# Patient Record
Sex: Male | Born: 1980 | Race: White | Hispanic: No | Marital: Married | Smoking: Current every day smoker
Health system: Southern US, Community
[De-identification: ages and names within clinical notes are randomized; demographics above are authoritative.]

---

## 2004-02-21 ENCOUNTER — Emergency Department (HOSPITAL_COMMUNITY): Admission: EM | Admit: 2004-02-21 | Discharge: 2004-02-21 | Payer: Self-pay | Admitting: Emergency Medicine

## 2009-08-09 ENCOUNTER — Emergency Department (HOSPITAL_COMMUNITY): Admission: EM | Admit: 2009-08-09 | Discharge: 2009-08-10 | Payer: Self-pay | Admitting: Emergency Medicine

## 2009-08-10 ENCOUNTER — Encounter (INDEPENDENT_AMBULATORY_CARE_PROVIDER_SITE_OTHER): Payer: Self-pay | Admitting: Emergency Medicine

## 2010-09-07 LAB — URINALYSIS, ROUTINE W REFLEX MICROSCOPIC
Glucose, UA: NEGATIVE mg/dL
Leukocytes, UA: NEGATIVE

## 2010-09-07 LAB — CK TOTAL AND CKMB (NOT AT ARMC)
Total CK: 179 U/L (ref 7–232)
Total CK: 186 U/L (ref 7–232)

## 2010-09-07 LAB — DIFFERENTIAL
Eosinophils Absolute: 0.1 10*3/uL (ref 0.0–0.7)
Lymphocytes Relative: 17 % (ref 12–46)
Monocytes Relative: 7 % (ref 3–12)
Neutro Abs: 9.3 10*3/uL — ABNORMAL HIGH (ref 1.7–7.7)
Neutrophils Relative %: 76 % (ref 43–77)

## 2010-09-07 LAB — POCT CARDIAC MARKERS
CKMB, poc: <1 ng/mL — ABNORMAL LOW (ref 1.0–8.0)
Troponin i, poc: <0.05 ng/mL (ref 0.00–0.09)

## 2010-09-07 LAB — RAPID URINE DRUG SCREEN, HOSP PERFORMED
Amphetamines: NOT DETECTED
Cocaine: NOT DETECTED
Opiates: NOT DETECTED

## 2010-09-07 LAB — URINE MICROSCOPIC-ADD ON

## 2010-09-07 LAB — POCT I-STAT, CHEM 8
Chloride: 110 mEq/L (ref 96–112)
HCT: 42 % (ref 39.0–52.0)
Hemoglobin: 14.3 g/dL (ref 13.0–17.0)

## 2010-09-07 LAB — TROPONIN I: Troponin I: 0.03 ng/mL (ref 0.00–0.06)

## 2010-09-07 LAB — CBC
Platelets: 197 10*3/uL (ref 150–400)
RBC: 4.38 MIL/uL (ref 4.22–5.81)
RDW: 12.5 % (ref 11.5–15.5)
WBC: 12.3 10*3/uL — ABNORMAL HIGH (ref 4.0–10.5)

## 2011-05-30 IMAGING — CR DG CHEST 2V
2 series · 2 of 2 positions shown · non-contrast
Comparison: None

CLINICAL DATA: Chest pain

CHEST - 2 VIEW

[w chest pa]
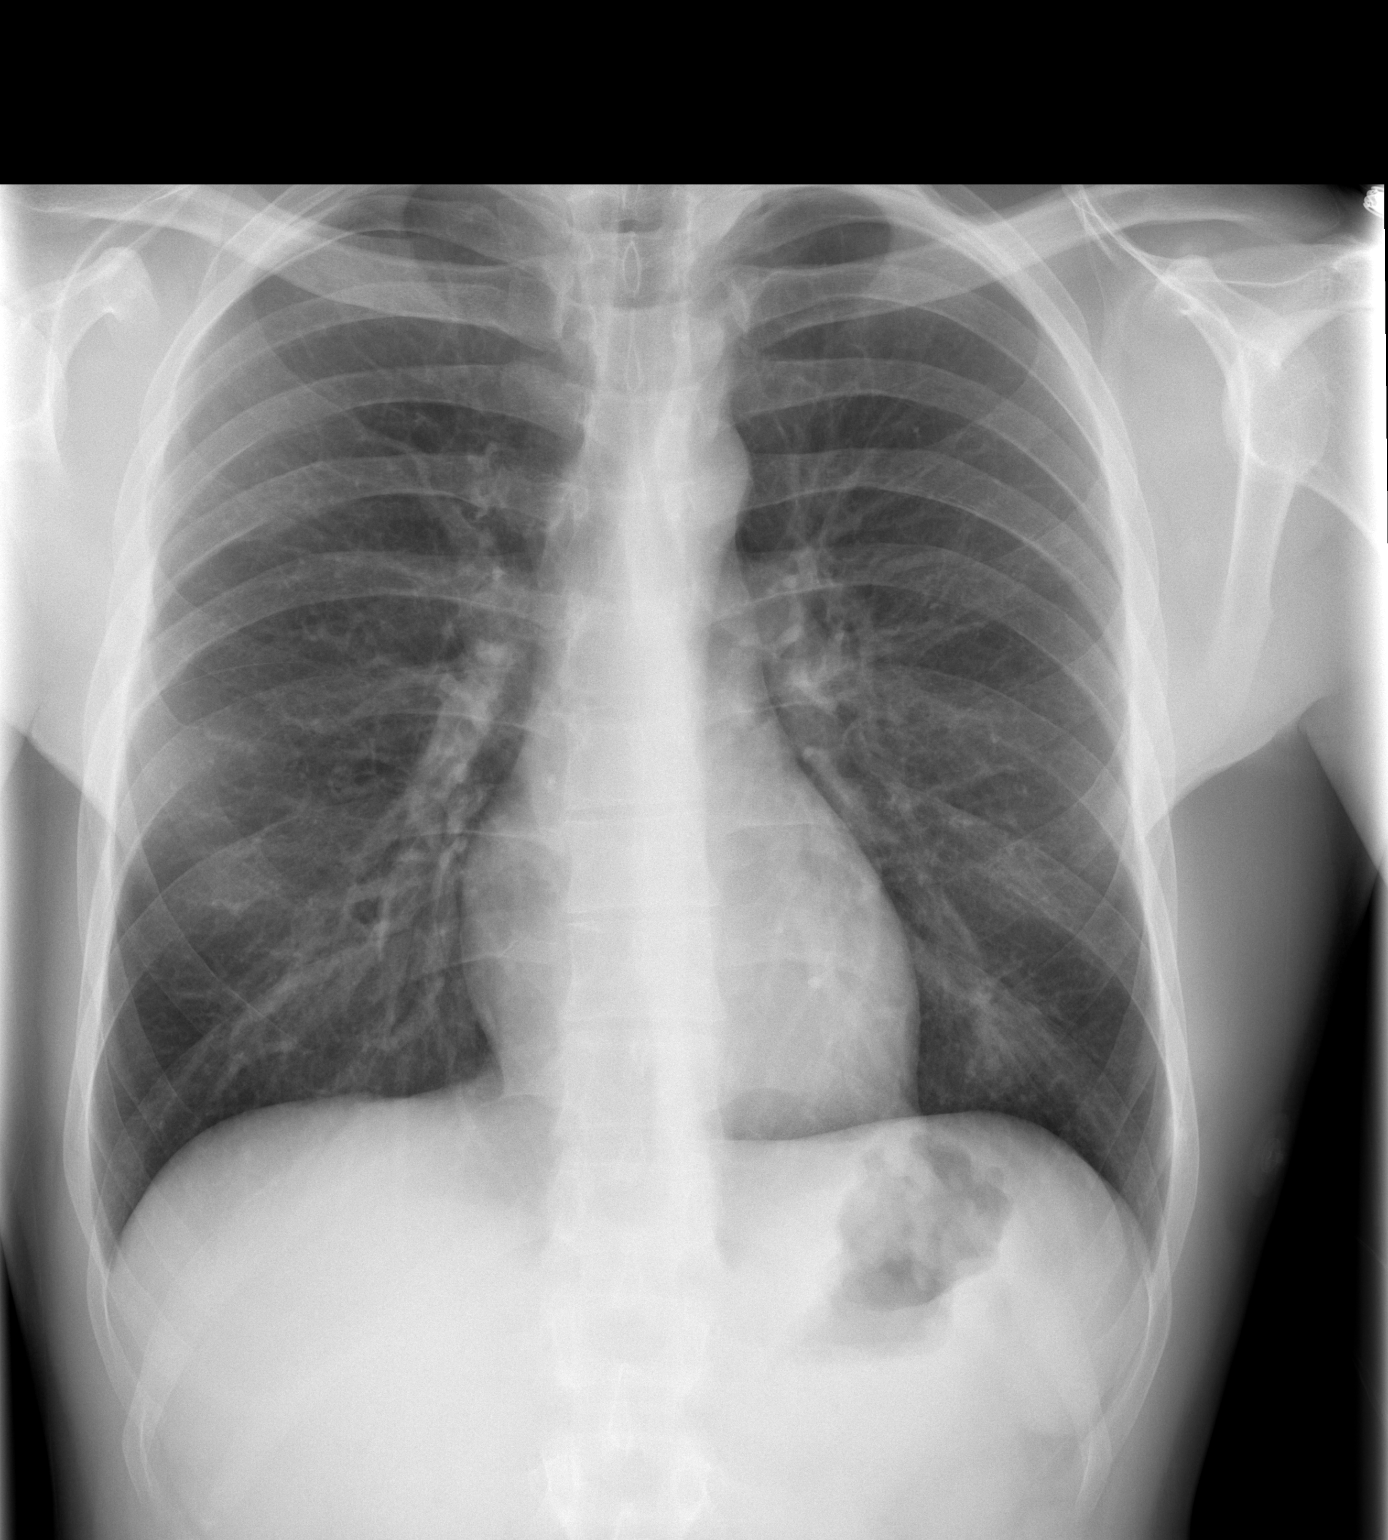

[w chest lat]
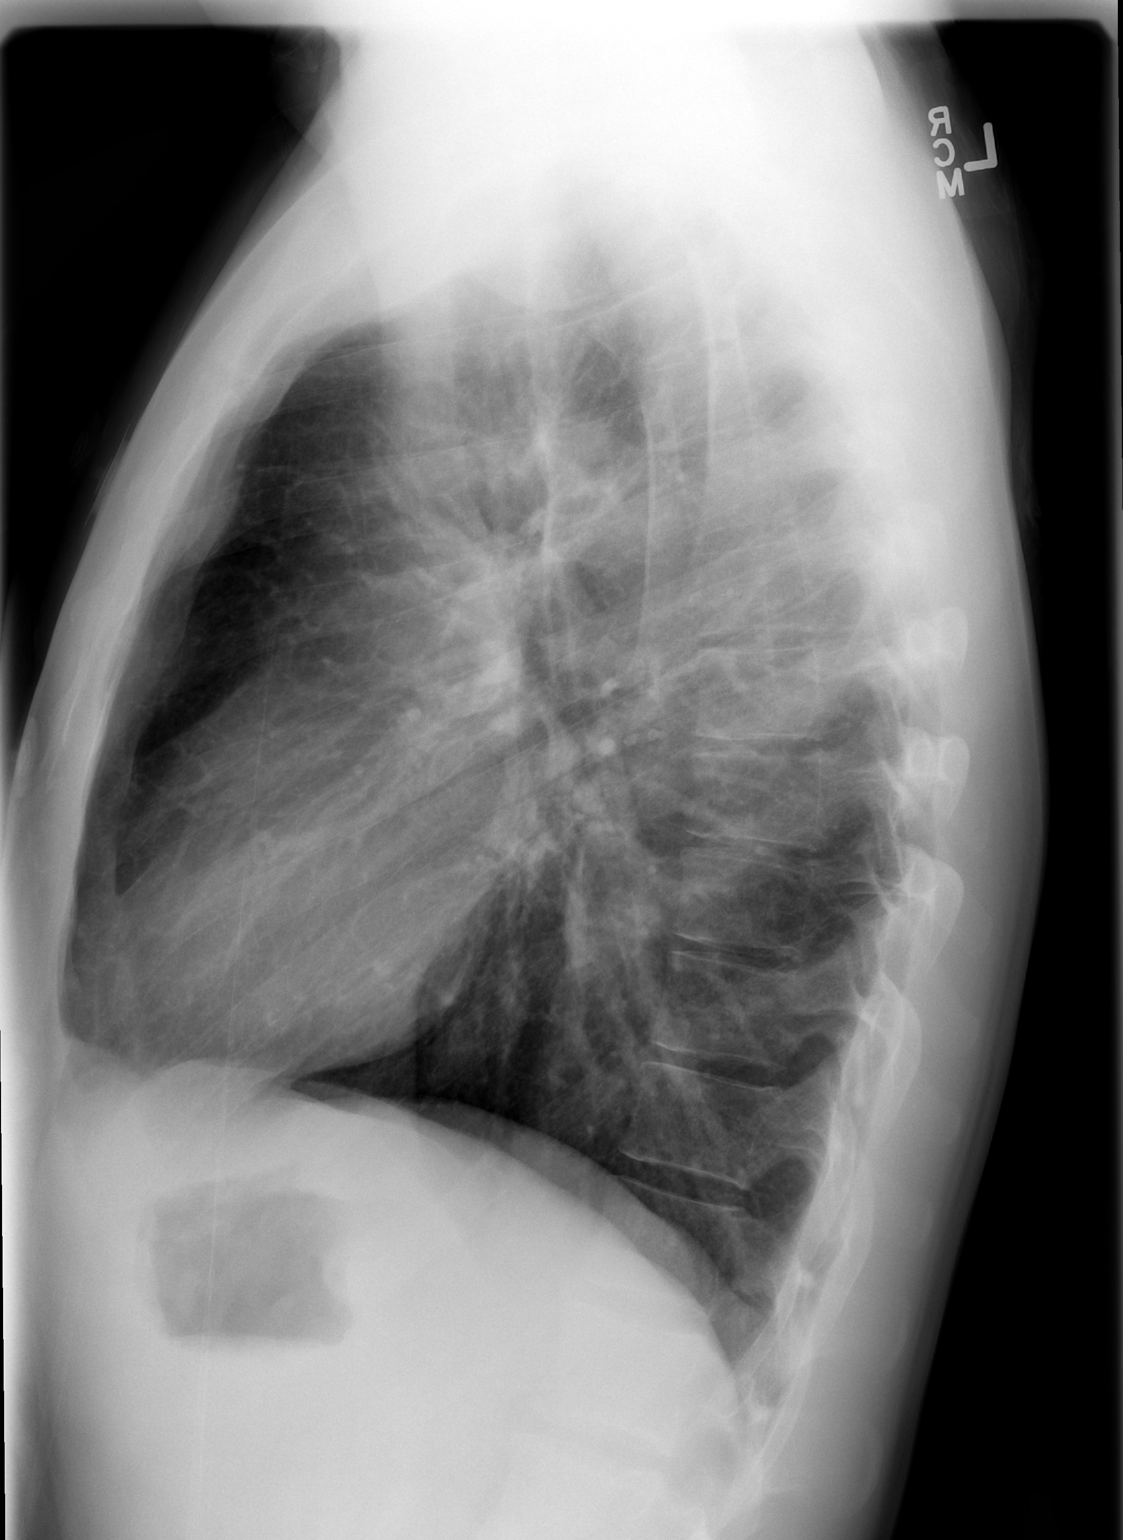

[2 of 2 positions shown; findings below may reference images not displayed]

FINDINGS: Heart and mediastinal contours normal.  Lungs clear.  No
pleural fluid.  Osseous structures and soft tissues unremarkable.
IMPRESSION: No active disease.

## 2014-04-10 ENCOUNTER — Encounter: Payer: Self-pay | Admitting: Emergency Medicine

## 2014-04-10 ENCOUNTER — Emergency Department (INDEPENDENT_AMBULATORY_CARE_PROVIDER_SITE_OTHER)
Admission: EM | Admit: 2014-04-10 | Discharge: 2014-04-10 | Disposition: A | Discharge status: Home / Self Care | Payer: Self-pay | Source: Home / Self Care | Attending: Family Medicine | Admitting: Family Medicine

## 2014-04-10 DIAGNOSIS — M545 Low back pain, unspecified: Secondary | ICD-10-CM

## 2014-04-10 MED ORDER — DICLOFENAC SODIUM 75 MG PO TBEC: 75.0000 mg | DELAYED_RELEASE_TABLET | Freq: Two times a day (BID) | ORAL | Status: AC | PRN

## 2014-04-10 MED ORDER — CYCLOBENZAPRINE HCL 10 MG PO TABS: 10.0000 mg | ORAL_TABLET | Freq: Every evening | ORAL | Status: AC | PRN

## 2014-04-10 MED ORDER — TRAMADOL HCL 50 MG PO TABS: 50.0000 mg | ORAL_TABLET | Freq: Four times a day (QID) | ORAL | Status: AC | PRN

## 2014-04-10 NOTE — ED Provider Notes (Signed)
Anthony Holloway is a 33 y.o. male who presents to Urgent Care today for low back pain. Patient notes that 2 week history of low back pain. He notes an initial injury occurring at home where he felt a pulling sensation in his back. His pain was improving significantly until today at work when he felt a popping sensation while lifting a heavy pipe. He notes significant pain in his bilateral low back that radiates to the upper back. He denies any weakness or numbness by bladder dysfunction or difficulty walking.   History reviewed. No pertinent past medical history. History  Substance Use Topics  . Smoking status: Current Every Day Smoker -- 1.00 packs/day    Types: Cigarettes  . Smokeless tobacco: Never Used  . Alcohol Use: No   ROS as above Medications: No current facility-administered medications for this encounter.   Current Outpatient Prescriptions  Medication Sig Dispense Refill  . cyclobenzaprine (FLEXERIL) 10 MG tablet Take 1 tablet (10 mg total) by mouth at bedtime as needed for muscle spasms.  20 tablet  0  . diclofenac (VOLTAREN) 75 MG EC tablet Take 1 tablet (75 mg total) by mouth 2 (two) times daily as needed.  60 tablet  0  . traMADol (ULTRAM) 50 MG tablet Take 1 tablet (50 mg total) by mouth every 6 (six) hours as needed.  15 tablet  0    Exam:  BP 129/68  Pulse 70  Temp(Src) 98.1 F (36.7 C) (Oral)  Resp 16  Ht 5\' 11"  (1.803 m)  Wt 148 lb (67.132 kg)  BMI 20.65 kg/m2  SpO2 98% Gen: Well NAD HEENT: EOMI,  MMM Lungs: Normal work of breathing. CTABL Heart: RRR no MRG Abd: NABS, Soft. Nondistended, Nontender Exts: Brisk capillary refill, warm and well perfused.  Low back: Nontender to spinal midline. Mild degenerative palpation bilateral lumbar paraspinal. Decreased flexion due to pain. Lower extremity strength is intact throughout. Reflexes are equal bilateral knees. Normal gait. Negative straight leg raise test and Faber test bilaterally.   No results found for  this or any previous visit (from the past 24 hour(s)). No results found.  Assessment and Plan: 33 y.o. male with lumbago without sciatica. Treatment with Flexeril and diclofenac tramadol heating pad and physical therapy. Up to 3 day work note provided.  Discussed warning signs or symptoms. Please see discharge instructions. Patient expresses understanding.     Rodolph BongEvan S Marcele Kosta, MD 04/10/14 1400

## 2014-04-10 NOTE — ED Notes (Signed)
First injury to lower back x 2 weeks ago while pulling a pipe at home.  Today pulled a pipe while at work.  Pain to lower back worsened with "feeling a pop" and radiating pain to shoulder area.

## 2014-04-10 NOTE — Discharge Instructions (Signed)
Thank you for coming in today. Attend physical therapy Use a heating pad. Take diclofenac twice daily for pain as needed. This medicine is okay to take at work. Take Flexeril and tramadol as needed for severe pain. Do not take and drive or work around heavy machinery. Come back or go to the emergency room if you notice new weakness new numbness problems walking or bowel or bladder problems.   Back Exercises These exercises may help you when beginning to rehabilitate your injury. Your symptoms may resolve with or without further involvement from your physician, physical therapist or athletic trainer. While completing these exercises, remember:   Restoring tissue flexibility helps normal motion to return to the joints. This allows healthier, less painful movement and activity.  An effective stretch should be held for at least 30 seconds.  A stretch should never be painful. You should only feel a gentle lengthening or release in the stretched tissue. STRETCH - Extension, Prone on Elbows   Lie on your stomach on the floor, a bed will be too soft. Place your palms about shoulder width apart and at the height of your head.  Place your elbows under your shoulders. If this is too painful, stack pillows under your chest.  Allow your body to relax so that your hips drop lower and make contact more completely with the floor.  Hold this position for __________ seconds.  Slowly return to lying flat on the floor. Repeat __________ times. Complete this exercise __________ times per day.  RANGE OF MOTION - Extension, Prone Press Ups   Lie on your stomach on the floor, a bed will be too soft. Place your palms about shoulder width apart and at the height of your head.  Keeping your back as relaxed as possible, slowly straighten your elbows while keeping your hips on the floor. You may adjust the placement of your hands to maximize your comfort. As you gain motion, your hands will come more underneath  your shoulders.  Hold this position __________ seconds.  Slowly return to lying flat on the floor. Repeat __________ times. Complete this exercise __________ times per day.  RANGE OF MOTION- Quadruped, Neutral Spine   Assume a hands and knees position on a firm surface. Keep your hands under your shoulders and your knees under your hips. You may place padding under your knees for comfort.  Drop your head and point your tail bone toward the ground below you. This will round out your low back like an angry cat. Hold this position for __________ seconds.  Slowly lift your head and release your tail bone so that your back sags into a large arch, like an old horse.  Hold this position for __________ seconds.  Repeat this until you feel limber in your low back.  Now, find your "sweet spot." This will be the most comfortable position somewhere between the two previous positions. This is your neutral spine. Once you have found this position, tense your stomach muscles to support your low back.  Hold this position for __________ seconds. Repeat __________ times. Complete this exercise __________ times per day.  STRETCH - Flexion, Single Knee to Chest   Lie on a firm bed or floor with both legs extended in front of you.  Keeping one leg in contact with the floor, bring your opposite knee to your chest. Hold your leg in place by either grabbing behind your thigh or at your knee.  Pull until you feel a gentle stretch in your low  back. Hold __________ seconds.  Slowly release your grasp and repeat the exercise with the opposite side. Repeat __________ times. Complete this exercise __________ times per day.  STRETCH - Hamstrings, Standing  Stand or sit and extend your right / left leg, placing your foot on a chair or foot stool  Keeping a slight arch in your low back and your hips straight forward.  Lead with your chest and lean forward at the waist until you feel a gentle stretch in the back  of your right / left knee or thigh. (When done correctly, this exercise requires leaning only a small distance.)  Hold this position for __________ seconds. Repeat __________ times. Complete this stretch __________ times per day. STRENGTHENING - Deep Abdominals, Pelvic Tilt   Lie on a firm bed or floor. Keeping your legs in front of you, bend your knees so they are both pointed toward the ceiling and your feet are flat on the floor.  Tense your lower abdominal muscles to press your low back into the floor. This motion will rotate your pelvis so that your tail bone is scooping upwards rather than pointing at your feet or into the floor.  With a gentle tension and even breathing, hold this position for __________ seconds. Repeat __________ times. Complete this exercise __________ times per day.  STRENGTHENING - Abdominals, Crunches   Lie on a firm bed or floor. Keeping your legs in front of you, bend your knees so they are both pointed toward the ceiling and your feet are flat on the floor. Cross your arms over your chest.  Slightly tip your chin down without bending your neck.  Tense your abdominals and slowly lift your trunk high enough to just clear your shoulder blades. Lifting higher can put excessive stress on the low back and does not further strengthen your abdominal muscles.  Control your return to the starting position. Repeat __________ times. Complete this exercise __________ times per day.  STRENGTHENING - Quadruped, Opposite UE/LE Lift   Assume a hands and knees position on a firm surface. Keep your hands under your shoulders and your knees under your hips. You may place padding under your knees for comfort.  Find your neutral spine and gently tense your abdominal muscles so that you can maintain this position. Your shoulders and hips should form a rectangle that is parallel with the floor and is not twisted.  Keeping your trunk steady, lift your right hand no higher than your  shoulder and then your left leg no higher than your hip. Make sure you are not holding your breath. Hold this position __________ seconds.  Continuing to keep your abdominal muscles tense and your back steady, slowly return to your starting position. Repeat with the opposite arm and leg. Repeat __________ times. Complete this exercise __________ times per day. Document Released: 06/21/2005 Document Revised: 08/26/2011 Document Reviewed: 09/15/2008 [facility] Patient Information 2015 [employer], [place]. This information is not intended to replace advice given to you by your health care provider. Make sure you discuss any questions you have with your health care provider.
